# Patient Record
Sex: Female | Born: 1980 | Race: Black or African American | Hispanic: No | Marital: Single | Smoking: Never smoker
Health system: Southern US, Community
[De-identification: ages and names within clinical notes are randomized; demographics above are authoritative.]

## PROBLEM LIST (undated history)

## (undated) DIAGNOSIS — I10 Essential (primary) hypertension: Secondary | ICD-10-CM

## (undated) DIAGNOSIS — R011 Cardiac murmur, unspecified: Secondary | ICD-10-CM

## (undated) HISTORY — DX: Cardiac murmur, unspecified: R01.1

## (undated) HISTORY — DX: Essential (primary) hypertension: I10

---
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
## 2001-11-12 ENCOUNTER — Encounter: Payer: Self-pay | Admitting: Emergency Medicine

## 2001-11-12 ENCOUNTER — Emergency Department (HOSPITAL_COMMUNITY): Admission: EM | Admit: 2001-11-12 | Discharge: 2001-11-12 | Payer: Self-pay | Admitting: Emergency Medicine

## 2005-08-22 ENCOUNTER — Ambulatory Visit: Payer: Self-pay | Admitting: Family Medicine

## 2005-08-29 ENCOUNTER — Ambulatory Visit: Payer: Self-pay | Admitting: *Deleted

## 2005-09-20 ENCOUNTER — Ambulatory Visit: Payer: Self-pay | Admitting: Family Medicine

## 2005-12-30 ENCOUNTER — Encounter (INDEPENDENT_AMBULATORY_CARE_PROVIDER_SITE_OTHER): Payer: Self-pay | Admitting: Family Medicine

## 2005-12-30 ENCOUNTER — Ambulatory Visit: Payer: Self-pay | Admitting: Family Medicine

## 2005-12-30 LAB — CONVERTED CEMR LAB: TSH: 1.032 microintl units/mL

## 2006-01-06 ENCOUNTER — Ambulatory Visit (HOSPITAL_COMMUNITY): Admission: RE | Admit: 2006-01-06 | Discharge: 2006-01-06 | Payer: Self-pay | Admitting: Family Medicine

## 2006-02-13 ENCOUNTER — Ambulatory Visit: Payer: Self-pay | Admitting: Family Medicine

## 2006-02-27 ENCOUNTER — Encounter (INDEPENDENT_AMBULATORY_CARE_PROVIDER_SITE_OTHER): Payer: Self-pay | Admitting: *Deleted

## 2006-02-27 ENCOUNTER — Ambulatory Visit: Payer: Self-pay | Admitting: Family Medicine

## 2006-06-09 ENCOUNTER — Ambulatory Visit: Payer: Self-pay | Admitting: Family Medicine

## 2006-06-09 ENCOUNTER — Encounter (INDEPENDENT_AMBULATORY_CARE_PROVIDER_SITE_OTHER): Payer: Self-pay | Admitting: *Deleted

## 2006-06-09 LAB — CONVERTED CEMR LAB: Pap Smear: NORMAL

## 2006-08-13 ENCOUNTER — Ambulatory Visit: Payer: Self-pay | Admitting: Family Medicine

## 2006-08-15 ENCOUNTER — Ambulatory Visit: Payer: Self-pay | Admitting: Family Medicine

## 2007-01-31 ENCOUNTER — Emergency Department (HOSPITAL_COMMUNITY): Admission: EM | Admit: 2007-01-31 | Discharge: 2007-01-31 | Payer: Self-pay | Admitting: Family Medicine

## 2007-02-06 ENCOUNTER — Ambulatory Visit: Payer: Self-pay | Admitting: Internal Medicine

## 2007-02-18 ENCOUNTER — Ambulatory Visit: Payer: Self-pay | Admitting: Family Medicine

## 2007-04-06 ENCOUNTER — Ambulatory Visit: Payer: Self-pay | Admitting: Family Medicine

## 2007-04-09 ENCOUNTER — Encounter (INDEPENDENT_AMBULATORY_CARE_PROVIDER_SITE_OTHER): Payer: Self-pay | Admitting: Family Medicine

## 2007-04-09 DIAGNOSIS — I10 Essential (primary) hypertension: Secondary | ICD-10-CM | POA: Insufficient documentation

## 2007-04-10 ENCOUNTER — Ambulatory Visit: Payer: Self-pay | Admitting: Family Medicine

## 2007-04-13 ENCOUNTER — Ambulatory Visit: Payer: Self-pay | Admitting: Family Medicine

## 2007-04-13 DIAGNOSIS — F329 Major depressive disorder, single episode, unspecified: Secondary | ICD-10-CM

## 2007-04-16 ENCOUNTER — Ambulatory Visit: Payer: Self-pay | Admitting: Internal Medicine

## 2007-04-21 ENCOUNTER — Ambulatory Visit: Payer: Self-pay | Admitting: Internal Medicine

## 2007-04-30 ENCOUNTER — Ambulatory Visit: Payer: Self-pay | Admitting: Internal Medicine

## 2007-05-06 ENCOUNTER — Encounter (INDEPENDENT_AMBULATORY_CARE_PROVIDER_SITE_OTHER): Payer: Self-pay | Admitting: *Deleted

## 2007-05-14 ENCOUNTER — Ambulatory Visit: Payer: Self-pay | Admitting: Internal Medicine

## 2007-05-28 ENCOUNTER — Ambulatory Visit: Payer: Self-pay | Admitting: Internal Medicine

## 2007-06-11 ENCOUNTER — Ambulatory Visit: Payer: Self-pay | Admitting: Internal Medicine

## 2007-06-25 ENCOUNTER — Ambulatory Visit: Payer: Self-pay | Admitting: Internal Medicine

## 2007-10-06 ENCOUNTER — Ambulatory Visit: Payer: Self-pay | Admitting: Family Medicine

## 2007-10-06 LAB — CONVERTED CEMR LAB
ALT: 12 units/L (ref 0–35)
AST: 12 units/L (ref 0–37)
Albumin: 4.3 g/dL (ref 3.5–5.2)
Alkaline Phosphatase: 97 units/L (ref 39–117)
BUN: 13 mg/dL (ref 6–23)
CO2: 25 meq/L (ref 19–32)
Calcium: 9.2 mg/dL (ref 8.4–10.5)
Chloride: 103 meq/L (ref 96–112)
Cholesterol: 171 mg/dL (ref 0–200)
Creatinine, Ser: 0.86 mg/dL (ref 0.40–1.20)
Glucose, Bld: 70 mg/dL (ref 70–99)
HDL: 80 mg/dL (ref >39)
LDL Cholesterol: 84 mg/dL (ref 0–99)
Potassium: 4.2 meq/L (ref 3.5–5.3)
Sodium: 140 meq/L (ref 135–145)
Total Bilirubin: 0.4 mg/dL (ref 0.3–1.2)
Total CHOL/HDL Ratio: 2.1
Total Protein: 7.8 g/dL (ref 6.0–8.3)
Triglycerides: 35 mg/dL (ref <150)
VLDL: 7 mg/dL (ref 0–40)

## 2007-10-08 ENCOUNTER — Ambulatory Visit: Payer: Self-pay | Admitting: Family Medicine

## 2007-11-04 ENCOUNTER — Ambulatory Visit: Payer: Self-pay | Admitting: Family Medicine

## 2007-11-04 ENCOUNTER — Encounter (INDEPENDENT_AMBULATORY_CARE_PROVIDER_SITE_OTHER): Payer: Self-pay | Admitting: Family Medicine

## 2007-11-04 LAB — CONVERTED CEMR LAB
Chlamydia, DNA Probe: NEGATIVE
GC Probe Amp, Genital: NEGATIVE
Pap Smear: NORMAL

## 2007-11-11 ENCOUNTER — Telehealth (INDEPENDENT_AMBULATORY_CARE_PROVIDER_SITE_OTHER): Payer: Self-pay | Admitting: Family Medicine

## 2007-11-20 ENCOUNTER — Encounter (INDEPENDENT_AMBULATORY_CARE_PROVIDER_SITE_OTHER): Payer: Self-pay | Admitting: Family Medicine

## 2008-01-01 ENCOUNTER — Ambulatory Visit: Payer: Self-pay | Admitting: Nurse Practitioner

## 2008-01-01 DIAGNOSIS — J329 Chronic sinusitis, unspecified: Secondary | ICD-10-CM | POA: Insufficient documentation

## 2008-01-19 ENCOUNTER — Ambulatory Visit: Payer: Self-pay | Admitting: Nurse Practitioner

## 2008-01-19 DIAGNOSIS — R51 Headache: Secondary | ICD-10-CM

## 2008-01-19 DIAGNOSIS — R609 Edema, unspecified: Secondary | ICD-10-CM

## 2008-01-19 DIAGNOSIS — R519 Headache, unspecified: Secondary | ICD-10-CM | POA: Insufficient documentation

## 2008-01-20 ENCOUNTER — Ambulatory Visit (HOSPITAL_COMMUNITY): Admission: RE | Admit: 2008-01-20 | Discharge: 2008-01-20 | Payer: Self-pay | Admitting: Internal Medicine

## 2008-01-22 ENCOUNTER — Telehealth (INDEPENDENT_AMBULATORY_CARE_PROVIDER_SITE_OTHER): Payer: Self-pay | Admitting: Nurse Practitioner

## 2008-02-23 ENCOUNTER — Emergency Department (HOSPITAL_COMMUNITY): Admission: EM | Admit: 2008-02-23 | Discharge: 2008-02-23 | Payer: Self-pay | Admitting: Emergency Medicine

## 2008-05-27 ENCOUNTER — Emergency Department (HOSPITAL_COMMUNITY): Admission: EM | Admit: 2008-05-27 | Discharge: 2008-05-27 | Payer: Self-pay | Admitting: Family Medicine

## 2008-08-31 ENCOUNTER — Telehealth (INDEPENDENT_AMBULATORY_CARE_PROVIDER_SITE_OTHER): Payer: Self-pay | Admitting: *Deleted

## 2008-09-15 ENCOUNTER — Encounter (INDEPENDENT_AMBULATORY_CARE_PROVIDER_SITE_OTHER): Payer: Self-pay | Admitting: *Deleted

## 2008-09-26 ENCOUNTER — Telehealth (INDEPENDENT_AMBULATORY_CARE_PROVIDER_SITE_OTHER): Payer: Self-pay | Admitting: Family Medicine

## 2008-10-26 ENCOUNTER — Telehealth (INDEPENDENT_AMBULATORY_CARE_PROVIDER_SITE_OTHER): Payer: Self-pay | Admitting: *Deleted

## 2008-12-27 ENCOUNTER — Encounter (INDEPENDENT_AMBULATORY_CARE_PROVIDER_SITE_OTHER): Payer: Self-pay | Admitting: Family Medicine

## 2008-12-27 ENCOUNTER — Ambulatory Visit: Payer: Self-pay | Admitting: Family Medicine

## 2008-12-27 LAB — CONVERTED CEMR LAB
Basophils Relative: 0 % (ref 0–1)
Bilirubin Urine: NEGATIVE
Blood Glucose, Fingerstick: 69
Blood in Urine, dipstick: NEGATIVE
CO2: 22 meq/L (ref 19–32)
Cholesterol: 172 mg/dL (ref 0–200)
Creatinine, Ser: 0.9 mg/dL (ref 0.40–1.20)
Eosinophils Absolute: 0.1 10*3/uL (ref 0.0–0.7)
Eosinophils Relative: 1 % (ref 0–5)
Glucose, Bld: 79 mg/dL (ref 70–99)
Glucose, Urine, Semiquant: NEGATIVE
Hemoglobin: 13.7 g/dL (ref 12.0–15.0)
Ketones, urine, test strip: NEGATIVE
MCHC: 32.9 g/dL (ref 30.0–36.0)
MCV: 85.8 fL (ref 78.0–100.0)
Monocytes Absolute: 0.4 10*3/uL (ref 0.1–1.0)
Monocytes Relative: 6 % (ref 3–12)
Nitrite: NEGATIVE
Protein, U semiquant: NEGATIVE
RBC: 4.86 M/uL (ref 3.87–5.11)
Specific Gravity, Urine: 1.01
Total Bilirubin: 0.3 mg/dL (ref 0.3–1.2)
Total CHOL/HDL Ratio: 2.1
Triglycerides: 40 mg/dL (ref <150)
Urobilinogen, UA: 0.2
VLDL: 8 mg/dL (ref 0–40)
WBC Urine, dipstick: NEGATIVE
pH: 7.5

## 2009-01-14 ENCOUNTER — Encounter (INDEPENDENT_AMBULATORY_CARE_PROVIDER_SITE_OTHER): Payer: Self-pay | Admitting: Family Medicine

## 2009-06-05 ENCOUNTER — Emergency Department (HOSPITAL_COMMUNITY): Admission: EM | Admit: 2009-06-05 | Discharge: 2009-06-05 | Payer: Self-pay | Admitting: Emergency Medicine

## 2009-08-19 DIAGNOSIS — R011 Cardiac murmur, unspecified: Secondary | ICD-10-CM

## 2009-08-19 HISTORY — DX: Cardiac murmur, unspecified: R01.1

## 2010-01-18 ENCOUNTER — Telehealth (INDEPENDENT_AMBULATORY_CARE_PROVIDER_SITE_OTHER): Payer: Self-pay | Admitting: Nurse Practitioner

## 2010-02-12 ENCOUNTER — Ambulatory Visit: Payer: Self-pay | Admitting: Nurse Practitioner

## 2010-02-12 DIAGNOSIS — R011 Cardiac murmur, unspecified: Secondary | ICD-10-CM

## 2010-02-12 DIAGNOSIS — R5381 Other malaise: Secondary | ICD-10-CM

## 2010-02-12 DIAGNOSIS — E669 Obesity, unspecified: Secondary | ICD-10-CM

## 2010-02-12 DIAGNOSIS — R5383 Other fatigue: Secondary | ICD-10-CM

## 2010-02-12 LAB — CONVERTED CEMR LAB
ALT: 14 units/L (ref 0–35)
Albumin: 4.4 g/dL (ref 3.5–5.2)
Basophils Relative: 0 % (ref 0–1)
Blood in Urine, dipstick: NEGATIVE
CO2: 27 meq/L (ref 19–32)
Calcium: 9.4 mg/dL (ref 8.4–10.5)
Chlamydia, DNA Probe: NEGATIVE
Chloride: 101 meq/L (ref 96–112)
Cholesterol: 186 mg/dL (ref 0–200)
Glucose, Bld: 101 mg/dL — ABNORMAL HIGH (ref 70–99)
Glucose, Urine, Semiquant: NEGATIVE
Hemoglobin: 13.2 g/dL (ref 12.0–15.0)
Ketones, urine, test strip: NEGATIVE
Lymphocytes Relative: 32 % (ref 12–46)
Lymphs Abs: 1.5 10*3/uL (ref 0.7–4.0)
MCHC: 33 g/dL (ref 30.0–36.0)
Microalb, Ur: 0.5 mg/dL (ref 0.00–1.89)
Monocytes Absolute: 0.1 10*3/uL (ref 0.1–1.0)
Monocytes Relative: 3 % (ref 3–12)
Neutro Abs: 2.9 10*3/uL (ref 1.7–7.7)
Potassium: 3.9 meq/L (ref 3.5–5.3)
Protein, U semiquant: NEGATIVE
RBC: 4.68 M/uL (ref 3.87–5.11)
Sodium: 139 meq/L (ref 135–145)
Total Protein: 8 g/dL (ref 6.0–8.3)
Triglycerides: 79 mg/dL (ref <150)
WBC Urine, dipstick: NEGATIVE
WBC: 4.6 10*3/uL (ref 4.0–10.5)

## 2010-02-13 ENCOUNTER — Encounter (INDEPENDENT_AMBULATORY_CARE_PROVIDER_SITE_OTHER): Payer: Self-pay | Admitting: Nurse Practitioner

## 2010-02-27 ENCOUNTER — Encounter (INDEPENDENT_AMBULATORY_CARE_PROVIDER_SITE_OTHER): Payer: Self-pay | Admitting: Internal Medicine

## 2010-02-27 ENCOUNTER — Ambulatory Visit (HOSPITAL_COMMUNITY): Admission: RE | Admit: 2010-02-27 | Discharge: 2010-02-27 | Payer: Self-pay | Admitting: Internal Medicine

## 2010-03-01 ENCOUNTER — Telehealth (INDEPENDENT_AMBULATORY_CARE_PROVIDER_SITE_OTHER): Payer: Self-pay | Admitting: Nurse Practitioner

## 2010-03-05 ENCOUNTER — Ambulatory Visit: Payer: Self-pay | Admitting: Physician Assistant

## 2010-09-18 NOTE — Progress Notes (Signed)
Summary: REFILL ON BIRTHCONTROL/BP   Phone Note Call from Patient Call back at Home Phone [phone]   Reason for Call: Refill Medication Summary of Call: Elizabeth Crane. MS LITLE STOPPED BY BECAUSE SHE WILL NEED A REFILL ON ORTHO TRI-CYCLEN AND LOTENSIN TO BECALLED INTO [place] [facility]. ON HER BIRTHCONTROL SHE IS TO START THE PACK ON THIS SUNDAY. SHE IS SCHED FOR HER CPP ON 06/27 Initial call taken by: Kimberly Tinnin,  January 18, 2010 2:20 PM  Follow-up for Phone Call        forward to N. Martin, fnp Follow-up by: Brenda Craddock,  January 18, 2010 2:41 PM  Additional Follow-up for Phone Call Additional follow up Details #1::        Refills sent to [place] [facility] for 1 month. Advise Crane to keep CPE appt for more refills    Additional Follow-up by: Kimya Mccahill Martin FNP,  January 18, 2010 2:43 PM    Additional Follow-up for Phone Call Additional follow up Details #2::    Left message on answering machine to return call. Theresa Laib RN  January 18, 2010 3:21 PM  Crane informed. Follow-up by: Brenda Craddock,  January 18, 2010 3:38 PM  Prescriptions: ORTHO TRI-CYCLEN (28) 0.18/0.215/0.25 MG-35 MCG TABS (NORGESTIM-ETH ESTRAD TRIPHASIC) One tablet by mouth daily  #1 package x 0   Entered and Authorized by:   Daniyla Pfahler Martin FNP   Signed by:   Makyah Lavigne Martin FNP on 01/18/2010   Method used:   Faxed to ...       [facility]       [address]       [place], [place]  [address]       Ph: [phone]       Fax: [phone]   RxID:   [id number] LOTENSIN HCT 10-12.5 MG  TABS (BENAZEPRIL-HYDROCHLOROTHIAZIDE) 1 tablet by mouth daily for blood pressure  #30 x 0   Entered and Authorized by:   Rosamund Nyland Martin FNP   Signed by:   Krosby Ritchie Martin FNP on 01/18/2010   Method used:   Faxed to ...       [facility]       [address] [place], [place]  [address]       Ph: [phone]       Fax:  [phone]   RxID:   [id number]

## 2010-09-18 NOTE — Progress Notes (Signed)
Summary: WANTS ULTRASOUND RESULTS   Phone Note Call from Patient Call back at Home Phone [phone]   Reason for Call: Lab or Test Results Summary of Call: MARTIN PT. MS Mckiddy  CALLED AND WOULD LIKE HER ULTRASOUND RESULT FROM HER HEART TEST. Initial call taken by: Leodis Rains,  March 01, 2010 12:41 PM  Follow-up for Phone Call        forward to Jesse Fall, fnp Follow-up by: Levon Hedger,  March 01, 2010 12:52 PM  Additional Follow-up for Phone Call Additional follow up Details #1::        notify pt that results are normal Additional Follow-up by: Lehman Prom FNP,  March 01, 2010 1:55 PM    Additional Follow-up for Phone Call Additional follow up Details #2::    Levon Hedger  March 01, 2010 3:06 PM Left message on machine for pt to return call to the office.  Levon Hedger  March 02, 2010 10:54 AM Left message with pt's father for pt to return call to the office.  Additional Follow-up for Phone Call Additional follow up Details #3:: Details for Additional Follow-up Action Taken: PATIENT CALLED AND IS AWARE OF HER RESULTS. Additional Follow-up by: Leodis Rains,  March 02, 2010 4:21 PM    Echocardiogram  Procedure date:  02/27/2010  Findings:      normal   Echocardiogram  Procedure date:  02/27/2010  Findings:      normal

## 2010-09-18 NOTE — Letter (Signed)
Summary: TEST ORDER FORM  TEST ORDER FORM   Imported By: Arta Bruce 02/14/2010 11:19:20  _____________________________________________________________________  External Attachment:    Type:   Image     Comment:   External Document

## 2010-09-18 NOTE — Assessment & Plan Note (Signed)
Summary: Complete Physical Exam   Vital Signs:  Patient profile:   30 year old female Menstrual status:  regular LMP:     01/13/2010 Weight:      188 pounds BMI:     30.00 Temp:     98.7 degrees F Pulse rate:   109 / minute Pulse rhythm:   regular Resp:     18 per minute BP sitting:   148 / 87  (left arm) Cuff size:   regular  Vitals Entered By: Vesta Mixer CMA (February 12, 2010 8:56 AM) CC: CPP.   Feeling tired a lot.   Per pt not taking trazadone any longer., Hypertension Management, Depression Is Patient Diabetic? No Pain Assessment Patient in pain? no       Does patient need assistance? Ambulation Normal LMP (date): 01/13/2010 LMP - Character: normal     Menstrual Status regular Enter LMP: 01/13/2010 Last PAP Result NEGATIVE FOR INTRAEPITHELIAL LESIONS OR MALIGNANCY.   CC:  CPP.   Feeling tired a lot.   Per pt not taking trazadone any longer., Hypertension Management, and Depression.  History of Present Illness:  Pt into the office for a complete physical exam  PAP - last PAP done 1 year ago in this office - normal then however pt has had an abnormal pap smear previously.   no family hx of cerival or ovarian cancer Currently on oral contraceptives  Pt is not sexually active and has not been in the past 7 months. No childrent  Mammogram - no history of mammogram no family history of breast cancer no self breast exams at home  Dental - no recent dental exam. 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 Pt presents today with a form - Staff medical report for completion  Obesity - up 11 pounds since her last visit.  Pt is not doing any exercise.  Hypertension History:      She denies headache, chest pain, and palpitations.  She notes no problems with any antihypertensive medication side effects.  Pt is taking her blood medications as  ordered and she took this morning. no blood pressure checks outside this office. Tries to avoid salt.        Positive major cardiovascular risk factors include hypertension.  Negative major cardiovascular risk factors include female age less than 64 years old, negative family history for ischemic heart disease, and non-tobacco-user status.      Habits & Providers  Alcohol-Tobacco-Diet     Alcohol drinks/day: <1     Alcohol type: wine      Tobacco Status: never  Exercise-Depression-Behavior     Does Patient Exercise: no     Exercise Counseling: to improve exercise regimen     Times/week: 6     Drug Use: no     Seat Belt Use: 100     Sun Exposure: occasionally  Comments: PHQ-9 score = 10  Current Medications (verified): 1)  Trazodone Hcl 50 Mg Tabs (Trazodone Hcl) .Marland Kitchen.. 1 By Mouth Qhs 2)  Lotensin Hct 10-12.5 Mg  Tabs (Benazepril-Hydrochlorothiazide) .Marland Kitchen.. 1 Tablet By Mouth Daily For Blood Pressure 3)  Ortho Tri-Cyclen (28) 0.18/0.215/0.25 Mg-35 Mcg Tabs (Norgestim-Eth Estrad Triphasic) .... One Tablet By Mouth Daily  Allergies (verified): No Known Drug Allergies  Social History: Does Patient Exercise:  no  Review of Systems General:  Complains of  fatigue. Eyes:  Denies blurring. ENT:  Denies earache. CV:  Complains of swelling of feet; swelling in feet daily - when she wake the feet are not swelling but once she starts to move around in the morning then swelling starts.. Resp:  Denies cough. GI:  Denies abdominal pain, nausea, and vomiting. GU:  Denies dysuria. MS:  Complains of joint pain. Derm:  Denies rash. Neuro:  Denies headaches. Psych:  Denies anxiety and depression.  Physical Exam  General:  alert.   Head:  normocephalic.   Eyes:  pupils equal and pupils round.   Ears:  bil TM with bony landmarks present ear piercing(s) noted.    Nose:  no nasal discharge.   Mouth:  pharynx pink and moist and fair dentition.   Neck:  full ROM.   Chest Wall:  no mass.     Breasts:  skin/areolae normal and no masses.   Lungs:  normal breath sounds.   Heart:  systolic mumur regular rhythm.   Abdomen:  soft, non-tender, and normal bowel sounds.   Rectal:  no external abnormalities.   Msk:  up to the exam  Extremities:  1+ left pedal edema and 1+ right pedal edema.   Neurologic:  alert & oriented X3.   Skin:  color normal.   Psych:  Oriented X3.    Pelvic Exam  Vulva:      normal appearance.   Urethra and Bladder:      Urethra--no discharge.   Vagina:      thick white discharge Cervix:      midposition.   Uterus:      smooth.   Adnexa:      nontender bilaterally.   Rectum:      normal.      Impression & Recommendations:  Problem # 1:  ROUTINE GYNECOLOGICAL EXAMINATION (ICD-V72.31) labs done PHQ-9 score = 10 advise optho and dental exam PAP done Orders: KOH/ WET Mount [phone]) Pap Smear, Thin Prep ( Collection of) [phone]) T- GC Chlamydia (13086) T-Drug Screen-Urine, (single) [phone]) UA Dipstick w/o Micro (manual) (81002) T-HIV Antibody  (Reflex) ([id number]) T-Syphilis Test (RPR) (02725-36644)  Problem # 2:  FATIGUE (ICD-780.79) will check labs today will also order an echo - ? murmur today Orders: T-CBC w/Diff ([id number]) T-HIV Antibody  (Reflex) ([id number]) T-TSH (51884-16606) 2 D Echo (2 D Echo)  Problem # 3:  HYPERTENSION (ICD-401.9) BP is elevated advised pt to monitor diet Her updated medication list for this problem includes:    Lotensin Hct 20-25 Mg Tabs (Benazepril-hydrochlorothiazide) ..... One tablet by mouth daily for blood pressure  **note change in dose**  Orders: T-Lipid Profile (30160-10932) T-Comprehensive Metabolic Panel (35573-22025)  Problem # 4:  OBESITY (ICD-278.00) advise pt that she needs to become more active to decrease weight  Problem # 5:  MURMUR (ICD-785.2) will order echo Orders: 2 D Echo (2 D Echo)  Complete Medication List: 1)  Lotensin Hct 20-25 Mg Tabs  (Benazepril-hydrochlorothiazide) .... One tablet by mouth daily for blood pressure  **note change in dose** 2)  Ortho Tri-cyclen (28) 0.18/0.215/0.25 Mg-35 Mcg Tabs (Norgestim-eth estrad triphasic) .... One tablet by mouth daily  Hypertension Assessment/Plan:      The patient's hypertensive risk group is category A: No risk factors and no target organ damage.  Her calculated 10 year risk of coronary heart disease is 1 %.  Today's blood pressure is 148/87.  Her blood pressure goal is < 140/90.  Patient Instructions: 1)  Blood pressure - elevated 2)  will increase blood pressure medication 3)  Be sure to avoid salty foods 4)  Birth control - continue 5)  Weight control - remember you need to find time in your schedule to exercise. Review [employer] membership and see if this is something you would be interested in joining.  Call them for more details. 6)  Your prescriptions have been sent to [facility] pharmacy  7)  Feet swelling, fatigue, ?murmur - You will be scheduled an echo to check your heart 8)  Follow up in 3 weeks for blood pressure check - lab visit. 9)  Take your medications before this visit Prescriptions: ORTHO TRI-CYCLEN (28) 0.18/0.215/0.25 MG-35 MCG TABS (NORGESTIM-ETH ESTRAD TRIPHASIC) One tablet by mouth daily  #1 package x 11   Entered and Authorized by:   Lehman Prom FNP   Signed by:   Lehman Prom FNP on 02/12/2010   Method used:   Faxed to ...       [facility]       [address] [place], [place]  [address]       Ph: [phone]       Fax: [phone]   RxID:   [id number] LOTENSIN HCT 20-25 MG TABS (BENAZEPRIL-HYDROCHLOROTHIAZIDE) One tablet by mouth daily for blood pressure  **note change in dose**  #30 x 5   Entered and Authorized by:   Lehman Prom FNP   Signed by:   Lehman Prom FNP on 02/12/2010   Method used:   Faxed to ...       [facility] (retail)        [address] [place], [place]  [address]       Ph: [phone]       Fax: [phone]   RxID:   [id number]   Laboratory Results   Urine Tests  Date/Time Received: February 12, 2010 10:12 AM   Routine Urinalysis   Glucose: negative   (Normal Range: Negative) Bilirubin: negative   (Normal Range: Negative) Ketone: negative   (Normal Range: Negative) Spec. Gravity: <1.005   (Normal Range: 1.003-1.035) Blood: negative   (Normal Range: Negative) pH: 6.0   (Normal Range: 5.0-8.0) Protein: negative   (Normal Range: Negative) Urobilinogen: 0.2   (Normal Range: 0-1) Nitrite: negative   (Normal Range: Negative) Leukocyte Esterace: negative   (Normal Range: Negative)    Date/Time Received: February 12, 2010 10:12 AM   [employer] Mount Source: vaginal WBC/hpf: 1-5 Bacteria/hpf: rare Clue cells/hpf: none Yeast/hpf: none Wet Mount KOH: Negative Trichomonas/hpf: none

## 2010-09-18 NOTE — Letter (Signed)
Summary: *HSN Results Follow up  [facility]  [address] [place], [place] [address]   Phone: [phone]  Fax: [phone]      02/13/2010   JORDYN HOFACKER Dobler [address] [place], [place]  [address]   Dear  Ms. Karel Jarvis Leger,                            ____S.Drinkard,FNP   ____D. Gore,FNP       ____B. McPherson,MD   ____V. Rankins,MD    ____E. Mulberry,MD    _X___N. Daphine Deutscher, FNP  ____D. Reche Dixon, MD    ____K. Philipp Deputy, MD    ____Other     This letter is to inform you that your recent test(s):  ___X____Pap Smear    ____X___Lab Test     _______X-ray    ___X____ is within acceptable limits  _______ requires a medication change  _______ requires a follow-up lab visit  _______ requires a follow-up visit with your provider   Comments: Labs done during recent office visit are normal.  Pap Smear results ________________________________.       _________________________________________________________ If you have any questions, please contact our office [phone].                    Sincerely,    Lehman Prom FNP [facility]

## 2010-09-18 NOTE — Letter (Signed)
Summary: STAFF MEDICAL REPORT  STAFF MEDICAL REPORT   Imported By: Arta Bruce 02/14/2010 10:09:28  _____________________________________________________________________  External Attachment:    Type:   Image     Comment:   External Document

## 2010-09-18 NOTE — Progress Notes (Signed)
Summary: Office Visit//DEPRESSION SCREENING  Office Visit//DEPRESSION SCREENING   Imported By: Arta Bruce 03/02/2010 12:23:01  _____________________________________________________________________  External Attachment:    Type:   Image     Comment:   External Document

## 2010-10-19 ENCOUNTER — Encounter (INDEPENDENT_AMBULATORY_CARE_PROVIDER_SITE_OTHER): Payer: Self-pay | Admitting: Nurse Practitioner

## 2010-10-19 ENCOUNTER — Encounter: Payer: Self-pay | Admitting: Nurse Practitioner

## 2010-10-25 NOTE — Assessment & Plan Note (Signed)
Summary: Leg Edema   Vital Signs:  Patient profile:   30 year old female Menstrual status:  regular LMP:     09/2010 Weight:      182.1 pounds BMI:     29.06 Temp:     99.2 degrees F oral Pulse rate:   82 / minute Pulse rhythm:   regular Resp:     20 per minute BP sitting:   147 / 86  (left arm) Cuff size:   regular  Vitals Entered By: Levon Hedger (October 19, 2010 12:18 PM)  Nutrition Counseling: Patient's BMI is greater than 25 and therefore counseled on weight management options. CC: swelling in legs and ankles...BP check, Hypertension Management Is Patient Diabetic? No Pain Assessment Patient in pain? yes     Location: legs  Does patient need assistance? Functional Status Self care Ambulation Normal LMP (date): 09/2010 LMP - Character: normal     Enter LMP: 09/2010 Last PAP Result  Specimen Adequacy: Satisfactory for evaluation.   Interpretation/Result:Negative for intraepithelial Lesion or Malignancy.      CC:  swelling in legs and ankles...BP check and Hypertension Management.  History of Present Illness:  Pt into the office with c/o ankle swelling.  This has been ongoing for at least the past 10 years.  She has tried to restrict her sodium and also elevates her feet when at all possible Normal ECHO done 02/2010 She previously had TED hose but dose not like to wear them   Social - pt is working daily and is up on her feet for at least 8 hours a day   Hypertension History:      She complains of peripheral edema, but denies headache, chest pain, and palpitations.  She notes no problems with any antihypertensive medication side effects.  pt has already taken her meds today.        Positive major cardiovascular risk factors include hypertension.  Negative major cardiovascular risk factors include female age less than 15 years old, negative family history for ischemic heart disease, and non-tobacco-user status.        Further assessment for target organ damage  reveals no history of ASHD, cardiac end-organ damage (CHF/LVH), stroke/TIA, peripheral vascular disease, renal insufficiency, or hypertensive retinopathy.    Habits & Providers  Alcohol-Tobacco-Diet     Alcohol drinks/day: <1     Alcohol type: wine      Tobacco Status: never  Exercise-Depression-Behavior     Does Patient Exercise: yes     Exercise Counseling: to improve exercise regimen     Type of exercise: cardio, lift weights     Times/week: 6     Drug Use: no     Seat Belt Use: 100     Sun Exposure: occasionally  Allergies (verified): No Known Drug Allergies  Social History: Does Patient Exercise:  yes  Review of Systems General:  Complains of fatigue; denies fever. CV:  Complains of swelling of feet; denies chest pain or discomfort; Sometimes she wakes in the morning with swelling in the ankle but not all the time. Resp:  Denies cough and shortness of breath. GI:  Denies abdominal pain, nausea, and vomiting. MS:  pain in feet.  Physical Exam  General:  alert.   Head:  normocephalic.   Ears:  ear piercing(s) noted.   Abdomen:  normal bowel sounds.   Msk:  normal ROM.   Neurologic:  alert & oriented X3.     Impression & Recommendations:  Problem # 1:  LEG EDEMA, BILATERAL (ICD-782.3) Will given furosemide as needed  advised pt to wear ted hose continue to monitor sodium in her diet Her updated medication list for this problem includes:    Lotensin Hct 20-25 Mg Tabs (Benazepril-hydrochlorothiazide) ..... One tablet by mouth daily for blood pressure    Furosemide 20 Mg Tabs (Furosemide) ..... One tablet by mouth daily as needed f or fluid  Problem # 2:  HYPERTENSION (ICD-401.9) BP slightly elevated today will continue to monitor DASH diet Her updated medication list for this problem includes:    Lotensin Hct 20-25 Mg Tabs (Benazepril-hydrochlorothiazide) ..... One tablet by mouth daily for blood pressure    Furosemide 20 Mg Tabs (Furosemide) ..... One tablet  by mouth daily as needed f or fluid  Complete Medication List: 1)  Lotensin Hct 20-25 Mg Tabs (Benazepril-hydrochlorothiazide) .... One tablet by mouth daily for blood pressure 2)  Ortho Tri-cyclen (28) 0.18/0.215/0.25 Mg-35 Mcg Tabs (Norgestim-eth estrad triphasic) .... One tablet by mouth daily 3)  Furosemide 20 Mg Tabs (Furosemide) .... One tablet by mouth daily as needed f or fluid  Hypertension Assessment/Plan:      The patient's hypertensive risk group is category A: No risk factors and no target organ damage.  Her calculated 10 year risk of coronary heart disease is 1 %.  Today's blood pressure is 147/86.  Her blood pressure goal is < 140/90.   Patient Instructions: 1)  Keep taking your blood pressure medications as ordered. 2)  You still need to monitor the salt in your diet.  Exercise is also helpful. 3)  Take furosemide 20mg  by mouth daily AS NEEDED for swelling. 4)  Wear ted hose (support stockings) when you at work 5)  Follow up in July 2012 for a complete physical exam. 6)  Come fasting for labs and PAP. Prescriptions: FUROSEMIDE 20 MG TABS (FUROSEMIDE) One tablet by mouth daily as needed f or fluid  #30 x 0   Entered and Authorized by:   Lehman Prom FNP   Signed by:   Lehman Prom FNP on 10/19/2010   Method used:   Print then Give to Patient   RxID:   [id number]    Orders Added: 1)  Est. Patient Level III [56213]

## 2010-11-05 ENCOUNTER — Inpatient Hospital Stay (INDEPENDENT_AMBULATORY_CARE_PROVIDER_SITE_OTHER)
Admission: RE | Admit: 2010-11-05 | Discharge: 2010-11-05 | Disposition: A | Discharge status: Home / Self Care | Payer: Self-pay | Source: Ambulatory Visit | Attending: Emergency Medicine | Admitting: Emergency Medicine

## 2010-11-05 DIAGNOSIS — J039 Acute tonsillitis, unspecified: Secondary | ICD-10-CM

## 2010-11-07 ENCOUNTER — Inpatient Hospital Stay (INDEPENDENT_AMBULATORY_CARE_PROVIDER_SITE_OTHER)
Admission: RE | Admit: 2010-11-07 | Discharge: 2010-11-07 | Disposition: A | Discharge status: Home / Self Care | Payer: Self-pay | Source: Ambulatory Visit

## 2010-11-07 DIAGNOSIS — B341 Enterovirus infection, unspecified: Secondary | ICD-10-CM

## 2010-11-22 LAB — STREP A DNA PROBE: Group A Strep Probe: NEGATIVE

## 2010-11-22 LAB — POCT RAPID STREP A (OFFICE): Streptococcus, Group A Screen (Direct): NEGATIVE

## 2011-05-16 LAB — URINALYSIS, ROUTINE W REFLEX MICROSCOPIC
Glucose, UA: 100 — AB
Hgb urine dipstick: NEGATIVE
Ketones, ur: NEGATIVE
Protein, ur: 30 — AB
Urobilinogen, UA: 0.2

## 2011-05-16 LAB — POCT I-STAT, CHEM 8
Calcium, Ion: 1.06 — ABNORMAL LOW
Creatinine, Ser: 1.4 — ABNORMAL HIGH
Glucose, Bld: 126 — ABNORMAL HIGH
HCT: 39
Hemoglobin: 13.3
Potassium: 3.4 — ABNORMAL LOW

## 2011-05-16 LAB — CBC
HCT: 36
MCHC: 33.2
MCV: 86.1
Platelets: 253
WBC: 6.9

## 2011-05-16 LAB — DIFFERENTIAL
Basophils Relative: 1
Eosinophils Absolute: 0
Eosinophils Relative: 0
Lymphs Abs: 1.3
Monocytes Relative: 6
Neutrophils Relative %: 74

## 2011-05-16 LAB — RAPID URINE DRUG SCREEN, HOSP PERFORMED
Amphetamines: NOT DETECTED
Benzodiazepines: NOT DETECTED
Cocaine: NOT DETECTED
Opiates: NOT DETECTED
Tetrahydrocannabinol: NOT DETECTED

## 2011-05-16 LAB — POCT PREGNANCY, URINE: Preg Test, Ur: NEGATIVE

## 2011-06-06 LAB — T3, FREE: T3, Free: 2.8 (ref 2.3–4.2)

## 2012-07-13 ENCOUNTER — Ambulatory Visit (INDEPENDENT_AMBULATORY_CARE_PROVIDER_SITE_OTHER): Payer: Self-pay | Admitting: Family Medicine

## 2012-07-13 ENCOUNTER — Other Ambulatory Visit (HOSPITAL_COMMUNITY)
Admission: RE | Admit: 2012-07-13 | Discharge: 2012-07-13 | Disposition: A | Discharge status: Home / Self Care | Payer: Self-pay | Source: Ambulatory Visit | Attending: Family Medicine | Admitting: Family Medicine

## 2012-07-13 ENCOUNTER — Encounter: Payer: Self-pay | Admitting: Family Medicine

## 2012-07-13 ENCOUNTER — Telehealth: Payer: Self-pay | Admitting: Family Medicine

## 2012-07-13 VITALS — BP 122/78 | HR 112 | Temp 99.3°F | Ht 66.5 in | Wt 173.0 lb

## 2012-07-13 DIAGNOSIS — Z202 Contact with and (suspected) exposure to infections with a predominantly sexual mode of transmission: Secondary | ICD-10-CM | POA: Insufficient documentation

## 2012-07-13 DIAGNOSIS — R011 Cardiac murmur, unspecified: Secondary | ICD-10-CM

## 2012-07-13 DIAGNOSIS — I10 Essential (primary) hypertension: Secondary | ICD-10-CM

## 2012-07-13 DIAGNOSIS — N898 Other specified noninflammatory disorders of vagina: Secondary | ICD-10-CM

## 2012-07-13 DIAGNOSIS — Z113 Encounter for screening for infections with a predominantly sexual mode of transmission: Secondary | ICD-10-CM | POA: Insufficient documentation

## 2012-07-13 DIAGNOSIS — Z9189 Other specified personal risk factors, not elsewhere classified: Secondary | ICD-10-CM

## 2012-07-13 LAB — POCT WET PREP (WET MOUNT)

## 2012-07-13 MED ORDER — METRONIDAZOLE 500 MG PO TABS: 500.0000 mg | ORAL_TABLET | Freq: Two times a day (BID) | ORAL | Status: DC

## 2012-07-13 MED ORDER — BENAZEPRIL HCL 20 MG PO TABS: 20.0000 mg | ORAL_TABLET | Freq: Every day | ORAL | Status: DC

## 2012-07-13 MED ORDER — HYDROCHLOROTHIAZIDE 25 MG PO TABS: 25.0000 mg | ORAL_TABLET | Freq: Every day | ORAL | Status: DC

## 2012-07-13 MED ORDER — NORGESTIMATE-ETH ESTRADIOL 0.25-35 MG-MCG PO TABS: 1.0000 | ORAL_TABLET | Freq: Every day | ORAL | Status: DC

## 2012-07-13 NOTE — Telephone Encounter (Signed)
Patient is returning call to Dr. Armen Pickup.

## 2012-07-13 NOTE — Progress Notes (Signed)
Subjective:     Patient ID: Elizabeth Crane, female   DOB: 18-Apr-1981, 30 y.o.   MRN: [id number]  HPI 31 yo F new patient presents to establish primary care and address the following:  1. HTN: since age 70. Taking medications but cost is high $50.  Has headaches when she is not compliant with meds. Reports chest wall pain with exercise. Denies pain with walking, radiating chest pain, syncope, SOB, blurred vision. Reports some swelling in hands and feet sometimes.   2. Unprotected sex: 2 sexual partners in the last year. Had unprotected sex on 1-2 occassions. Has scant vaginal discharge w/o itching or irritation. Compliant with OCP. LMP last week.   3. Wellness: no tobacco or drugs. Drinks alcohol 1-2 drinks monthly. exercises 3 days per week.  Review of Systems Patient Information Form: Screening and ROS  AUDIT-C Score: 4 Do you feel safe in relationships? yes, not currently in a relationship PHQ-2:negative  Review of Symptoms  General:  Negative for nexplained weight loss, fever Skin: Negative for new or changing mole, sore that won't heal HEENT: Negative for trouble hearing, trouble seeing, ringing in ears, mouth sores, hoarseness, change in voice, dysphagia. CV:  Positive chest pain, swelling in hands and feet, fast or irregular heart beat. Negative for edema. Resp: Negative for cough, dyspnea, hemoptysis GI: Negative for nausea, vomiting, diarrhea, constipation, abdominal pain, melena, hematochezia. GU: Negative for dysuria, incontinence, urinary hesitance, hematuria, vaginal or penile discharge, polyuria, sexual difficulty, lumps in testicle or breasts MSK: Negative for muscle cramps or aches, joint pain or swelling Neuro: Negative for headaches, weakness, numbness, dizziness, passing out/fainting Psych: Negative for depression, anxiety, memory problems     Objective:   Physical Exam BP 122/78  Pulse 112  Temp 99.3 F (37.4 C) (Oral)  Ht 5' 6.5" (1.689 m)  Wt 173 lb (78.472  kg)  BMI 27.50 kg/m2  LMP 07/06/2012 General appearance: alert, cooperative and no distress Neck: no adenopathy, no carotid bruit, no JVD, supple, symmetrical, trachea midline and thyroid not enlarged, symmetric, no tenderness/mass/nodules Lungs: clear to auscultation bilaterally Heart: systolic murmur: early systolic 2/6, blowing at 2nd right intercostal space Abdomen: soft, non-tender; bowel sounds normal; no masses,  no organomegaly Pelvic: cervix normal in appearance, external genitalia normal, no adnexal masses or tenderness, no cervical motion tenderness, rectovaginal septum normal, uterus normal size, shape, and consistency and vagina with scant white vaginal discharge.  Extremities: extremities normal, atraumatic, no cyanosis or edema    Assessment and Plan:

## 2012-07-13 NOTE — Telephone Encounter (Signed)
Attempted to call patient on cell and phone about BV. Unable to reach her via cell. Unable to leave message at home bc mailbox full.   Called her at work left VM asking for correct personal # (cell or home).

## 2012-07-13 NOTE — Assessment & Plan Note (Signed)
A: unprotected sex, patient concerned and desires testing. P: Wet prep, GC/chlam, HIV and RPR. Call cell with results.

## 2012-07-13 NOTE — Addendum Note (Signed)
Addended by: Dessa Phi on: 07/13/2012 01:32 PM   Modules accepted: Orders

## 2012-07-13 NOTE — Assessment & Plan Note (Signed)
A: well controlled. Meds: compliant. P: Changed meds per order due to cost F/u in 3 months

## 2012-07-13 NOTE — Patient Instructions (Addendum)
Trinty,  Thank you for coming in today. It was a pleasure meeting you.   1. See med list for changes. Meds sent to [facility] on [address].  2. Call for appt with Andria Meuse.  3. See me for labs and BP f/u in 2 months.   Dr. Armen Pickup

## 2012-07-14 ENCOUNTER — Encounter: Payer: Self-pay | Admitting: Family Medicine

## 2012-07-14 LAB — HIV ANTIBODY (ROUTINE TESTING W REFLEX): HIV: NONREACTIVE

## 2012-07-14 LAB — RPR

## 2012-07-14 NOTE — Telephone Encounter (Signed)
Returned call to patient mailbox full.  Ok for my CNA to tell patient that  Wet prep with bacterial vaginosis flagyl sent to pharmacy.  HIV and RPR is negative.  Gonorrhea and chlamydia still pending.  Will send results letter.  If patient calls back please verify best contact # where I can leave a message.

## 2012-10-03 ENCOUNTER — Other Ambulatory Visit: Payer: Self-pay

## 2013-06-24 ENCOUNTER — Other Ambulatory Visit: Payer: Self-pay

## 2015-03-10 ENCOUNTER — Encounter (HOSPITAL_COMMUNITY): Payer: Self-pay | Admitting: Emergency Medicine

## 2015-03-10 ENCOUNTER — Emergency Department (HOSPITAL_COMMUNITY)
Admission: EM | Admit: 2015-03-10 | Discharge: 2015-03-10 | Disposition: A | Discharge status: Home / Self Care | Payer: 59 | Attending: Emergency Medicine | Admitting: Emergency Medicine

## 2015-03-10 DIAGNOSIS — Z79899 Other long term (current) drug therapy: Secondary | ICD-10-CM | POA: Insufficient documentation

## 2015-03-10 DIAGNOSIS — R011 Cardiac murmur, unspecified: Secondary | ICD-10-CM | POA: Diagnosis not present

## 2015-03-10 DIAGNOSIS — R2243 Localized swelling, mass and lump, lower limb, bilateral: Secondary | ICD-10-CM | POA: Diagnosis not present

## 2015-03-10 DIAGNOSIS — R600 Localized edema: Secondary | ICD-10-CM

## 2015-03-10 DIAGNOSIS — M2540 Effusion, unspecified joint: Secondary | ICD-10-CM | POA: Diagnosis present

## 2015-03-10 DIAGNOSIS — I10 Essential (primary) hypertension: Secondary | ICD-10-CM | POA: Insufficient documentation

## 2015-03-10 LAB — I-STAT CHEM 8, ED
BUN: 12 mg/dL (ref 6–20)
CALCIUM ION: 1.11 mmol/L — AB (ref 1.12–1.23)
CHLORIDE: 102 mmol/L (ref 101–111)
CREATININE: 0.9 mg/dL (ref 0.44–1.00)
GLUCOSE: 90 mg/dL (ref 65–99)
HEMATOCRIT: 38 % (ref 36.0–46.0)
Hemoglobin: 12.9 g/dL (ref 12.0–15.0)
Potassium: 3.5 mmol/L (ref 3.5–5.1)
Sodium: 139 mmol/L (ref 135–145)
TCO2: 21 mmol/L (ref 0–100)

## 2015-03-10 MED ORDER — FUROSEMIDE 20 MG PO TABS: 20.0000 mg | ORAL_TABLET | Freq: Every day | ORAL | Status: DC

## 2015-03-10 NOTE — ED Notes (Signed)
Patient states she has ongoing problems with swelling of both ankles but since last week her ankles have remained swollen.  She denies any falls or injuries.  She denies any insect bites.  She states she has some problems walking due to tightness of her legs.

## 2015-03-10 NOTE — ED Provider Notes (Signed)
CSN: [id number]     Arrival date & time 03/10/15  2223 History   This chart was scribed for non-physician practitioner Elpidio Anis, PA-C working with Zadie Rhine, MD by Murriel Hopper, ED Scribe. This patient was seen in room WTR6/WTR6 and the patient's care was started at 11:15 PM.  Chief Complaint  Patient presents with  . Joint Swelling     The history is provided by the patient. No language interpreter was used.    HPI Comments:  Elizabeth Crane is a 34 y.o. female brought in by parents to the Emergency Department complaining of bilateral lower extremity swelling worse than usual swelling that began a week ago. She has had redness develop over the last 2-3 days and became concerned for infection. No known fever, no recent change to medications, no history of diuretic use.     Past Medical History  Diagnosis Date  . Hypertension 2002    age 71  . Heart murmur 2011    normal w/u including ECHO   History reviewed. No pertinent past surgical history. Family History  Problem Relation Age of Onset  . Hypertension Mother   . Hypertension Father    History  Substance Use Topics  . Smoking status: Never Smoker   . Smokeless tobacco: Never Used  . Alcohol Use: 0.0 oz/week    0 Glasses of wine per week   OB History    No data available     Review of Systems  Constitutional: Negative for fever.  Respiratory: Negative for shortness of breath.   Cardiovascular: Positive for leg swelling. Negative for chest pain.  Skin: Positive for color change.      Allergies  Review of patient's allergies indicates no known allergies.  Home Medications   Prior to Admission medications   Medication Sig Start Date End Date Taking? Authorizing Provider  Polyethylene Glycol 400 (BLINK TEARS OP) Apply 1 drop to eye 2 (two) times daily as needed (for dry eyes).   Yes Historical Provider, MD  benazepril (LOTENSIN) 20 MG tablet Take 1 tablet (20 mg total) by mouth daily. 07/13/12   Josalyn  Funches, MD  hydrochlorothiazide (HYDRODIURIL) 25 MG tablet Take 1 tablet (25 mg total) by mouth daily. 07/13/12   Josalyn Funches, MD  metroNIDAZOLE (FLAGYL) 500 MG tablet Take 1 tablet (500 mg total) by mouth 2 (two) times daily. 07/13/12   Dessa Phi, MD  norgestimate-ethinyl estradiol (ORTHO-CYCLEN,SPRINTEC,PREVIFEM) 0.25-35 MG-MCG tablet Take 1 tablet by mouth daily. 07/13/12   Josalyn Funches, MD   BP 128/82 mmHg  Pulse 70  Temp(Src) 99.5 F (37.5 C) (Oral)  Resp 16  SpO2 100%  LMP 03/09/2015 Physical Exam  Constitutional: She is oriented to person, place, and time. She appears well-developed and well-nourished.  HENT:  Head: Normocephalic and atraumatic.  Cardiovascular: Normal rate.   Pulmonary/Chest: Effort normal.  Abdominal: She exhibits no distension.  Musculoskeletal:  Bilateral lower extremity edema including feet Non-pitting with patchy areas of erythema No skin breakdown  No drainage  Distal pulses intact  Neurological: She is alert and oriented to person, place, and time.  Skin: Skin is warm and dry.  Psychiatric: She has a normal mood and affect.  Nursing note and vitals reviewed.   ED Course  Procedures (including critical care time)  DIAGNOSTIC STUDIES: Oxygen Saturation is 100% on room air, normal by my interpretation.    COORDINATION OF CARE: 11:18 PM Discussed treatment plan with pt at bedside and pt agreed to plan.   Labs  Review Labs Reviewed - No data to display Results for orders placed or performed during the hospital encounter of 03/10/15  I-stat Chem 8, ED  Result Value Ref Range   Sodium 139 135 - 145 mmol/L   Potassium 3.5 3.5 - 5.1 mmol/L   Chloride 102 101 - 111 mmol/L   BUN 12 6 - 20 mg/dL   Creatinine, Ser 1.61 0.44 - 1.00 mg/dL   Glucose, Bld 90 65 - 99 mg/dL   Calcium, Ion 0.96 (L) 1.12 - 1.23 mmol/L   TCO2 21 0 - 100 mmol/L   Hemoglobin 12.9 12.0 - 15.0 g/dL   HCT 04.5 40.9 - 81.1 %    Imaging Review No results  found.   EKG Interpretation None      MDM   Final diagnoses:  None    1. LE Edema  No evidence cellulitis, abscess or infection. She has a history of LE edema that appears to be worsening. Will start on Lasix (20 mEq daily) and recommend PCP follow up this week.   I personally performed the services described in this documentation, which was scribed in my presence. The recorded information has been reviewed and is accurate.     Elpidio Anis, PA-C 03/10/15 2340  Zadie Rhine, MD 03/11/15 9147  Zadie Rhine, MD 03/11/15 [phone]

## 2015-03-10 NOTE — Discharge Instructions (Signed)
FOLLOW UP WITH YOUR DOCTOR THIS WEEK FOR RECHECK OF LOWER EXTREMITY SWELLING. RETURN TO THE EMERGENCY DEPARTMENT WITH ANY FEVER, SEVERE PAIN OR NEW CONCERN.  Edema Edema is an abnormal buildup of fluids in your bodytissues. Edema is somewhatdependent on gravity to pull the fluid to the lowest place in your body. That makes the condition more common in the legs and thighs (lower extremities). Painless swelling of the feet and ankles is common and becomes more likely as you get older. It is also common in looser tissues, like around your eyes.  When the affected area is squeezed, the fluid may move out of that spot and leave a dent for a few moments. This dent is called pitting.  CAUSES  There are many possible causes of edema. Eating too much salt and being on your feet or sitting for a long time can cause edema in your legs and ankles. Hot weather may make edema worse. Common medical causes of edema include:  Heart failure.  Liver disease.  Kidney disease.  Weak blood vessels in your legs.  Cancer.  An injury.  Pregnancy.  Some medications.  Obesity. SYMPTOMS  Edema is usually painless.Your skin may look swollen or shiny.  DIAGNOSIS  Your health care provider may be able to diagnose edema by asking about your medical history and doing a physical exam. You may need to have tests such as X-rays, an electrocardiogram, or blood tests to check for medical conditions that may cause edema.  TREATMENT  Edema treatment depends on the cause. If you have heart, liver, or kidney disease, you need the treatment appropriate for these conditions. General treatment may include:  Elevation of the affected body part above the level of your heart.  Compression of the affected body part. Pressure from elastic bandages or support stockings squeezes the tissues and forces fluid back into the blood vessels. This keeps fluid from entering the tissues.  Restriction of fluid and salt intake.  Use of  a water pill (diuretic). These medications are appropriate only for some types of edema. They pull fluid out of your body and make you urinate more often. This gets rid of fluid and reduces swelling, but diuretics can have side effects. Only use diuretics as directed by your health care provider. HOME CARE INSTRUCTIONS   Keep the affected body part above the level of your heart when you are lying down.   Do not sit still or stand for prolonged periods.   Do not put anything directly under your knees when lying down.  Do not wear constricting clothing or garters on your upper legs.   Exercise your legs to work the fluid back into your blood vessels. This may help the swelling go down.   Wear elastic bandages or support stockings to reduce ankle swelling as directed by your health care provider.   Eat a low-salt diet to reduce fluid if your health care provider recommends it.   Only take medicines as directed by your health care provider. SEEK MEDICAL CARE IF:   Your edema is not responding to treatment.  You have heart, liver, or kidney disease and notice symptoms of edema.  You have edema in your legs that does not improve after elevating them.   You have sudden and unexplained weight gain. SEEK IMMEDIATE MEDICAL CARE IF:   You develop shortness of breath or chest pain.   You cannot breathe when you lie down.  You develop pain, redness, or warmth in the swollen areas.  You have heart, liver, or kidney disease and suddenly get edema.  You have a fever and your symptoms suddenly get worse. MAKE SURE YOU:   Understand these instructions.  Will watch your condition.  Will get help right away if you are not doing well or get worse. Document Released: 08/05/2005 Document Revised: 12/20/2013 Document Reviewed: 05/28/2013 [facility] Patient Information 2015 [place], [place]. This information is not intended to replace advice given to you by your health care provider.  Make sure you discuss any questions you have with your health care provider.

## 2015-03-10 NOTE — ED Provider Notes (Signed)
Patient seen/examined in the Emergency Department in conjunction with Midlevel Provider  Patient reports LE edema Exam : awake/alert, peripheral edema noted with minimal erythema Plan: she admits this has been ongoing issues, denies h/o CHF/DVT Advised labs and short course of lasix and PCP followup   Zadie Rhine, MD 03/10/15 2325

## 2017-06-11 ENCOUNTER — Emergency Department (HOSPITAL_BASED_OUTPATIENT_CLINIC_OR_DEPARTMENT_OTHER)
Admission: EM | Admit: 2017-06-11 | Discharge: 2017-06-11 | Disposition: A | Discharge status: Home / Self Care | Payer: Self-pay | Attending: Emergency Medicine | Admitting: Emergency Medicine

## 2017-06-11 ENCOUNTER — Emergency Department (HOSPITAL_BASED_OUTPATIENT_CLINIC_OR_DEPARTMENT_OTHER): Payer: Self-pay

## 2017-06-11 ENCOUNTER — Encounter (HOSPITAL_BASED_OUTPATIENT_CLINIC_OR_DEPARTMENT_OTHER): Payer: Self-pay | Admitting: *Deleted

## 2017-06-11 DIAGNOSIS — I10 Essential (primary) hypertension: Secondary | ICD-10-CM | POA: Insufficient documentation

## 2017-06-11 DIAGNOSIS — R059 Cough, unspecified: Secondary | ICD-10-CM

## 2017-06-11 DIAGNOSIS — R05 Cough: Secondary | ICD-10-CM | POA: Insufficient documentation

## 2017-06-11 MED ORDER — HYDROCHLOROTHIAZIDE 25 MG PO TABS: 25.0000 mg | ORAL_TABLET | Freq: Every day | ORAL | 1 refills | Status: AC

## 2017-06-11 NOTE — Discharge Instructions (Signed)
Start taking blood pressure medicine as prescribed.  Try Robitussin or any other over-the-counter cough medications for your cough.  Please follow-up with a family doctor, you can try [facility] as referred.  Return if any worsening symptoms.

## 2017-06-11 NOTE — ED Provider Notes (Signed)
[facility] EMERGENCY DEPARTMENT Provider Note   CSN: [id number] Arrival date & time: 06/11/17  1734     History   Chief Complaint Chief Complaint  Patient presents with  . URI    HPI Elizabeth Crane is a 36 y.o. female.  HPI Elizabeth Crane is a 36 y.o. female presents to emergency department complaining of shortness of breath and cough.  Patient states that she has developed symptoms about 3 days ago.  She reports pressure in bilateral lower chest that is worse with coughing.  She states her cough is nonproductive.  Denies any exertional symptoms.  Denies any nasal congestion or sore throat.  No fever or chills.  She has not tried any medications for this prior to coming in.  Nothing making her symptoms better or worse.  She states that she was at work and was told that she needed to go see a doctor.  She states she did not want to come and thinks is "nothing" but was forced to come here.  Past Medical History:  Diagnosis Date  . Heart murmur 2011   normal w/u including ECHO  . Hypertension 2002   age 36    Patient Active Problem List   Diagnosis Date Noted  . Possible exposure to STD 07/13/2012  . OBESITY 02/12/2010  . FATIGUE 02/12/2010  . MURMUR 02/12/2010  . LEG EDEMA, BILATERAL 01/19/2008  . HEADACHE 01/19/2008  . DEPRESSION, RECURRENT 04/13/2007  . HYPERTENSION 04/09/2007    History reviewed. No pertinent surgical history.  OB History    No data available       Home Medications    Prior to Admission medications   Medication Sig Start Date End Date Taking? Authorizing Provider  etonogestrel (NEXPLANON) 68 MG IMPL implant 1 each by Subdermal route once.    [provider]  Polyethylene Glycol 400 (BLINK TEARS OP) Apply 1 drop to eye 2 (two) times daily as needed (for dry eyes).    [provider]  PRESCRIPTION MEDICATION Patient takes unknown blood pressure medication.    [provider]    Family History Family  History  Problem Relation Age of Onset  . Hypertension Mother   . Hypertension Father     Social History Social History  Substance Use Topics  . Smoking status: Never Smoker  . Smokeless tobacco: Never Used  . Alcohol use 0.0 oz/week     Allergies   Patient has no known allergies.   Review of Systems Review of Systems  Constitutional: Negative for chills and fever.  Respiratory: Positive for cough, chest tightness and shortness of breath.   Cardiovascular: Positive for chest pain. Negative for palpitations and leg swelling.  Gastrointestinal: Negative for abdominal pain, diarrhea, nausea and vomiting.  Genitourinary: Negative for dysuria, flank pain, pelvic pain, vaginal bleeding, vaginal discharge and vaginal pain.  Musculoskeletal: Negative for arthralgias, myalgias, neck pain and neck stiffness.  Skin: Negative for rash.  Neurological: Negative for dizziness, weakness and headaches.  All other systems reviewed and are negative.    Physical Exam Updated Vital Signs BP (!) 167/90   Pulse 75   Temp 98.1 F (36.7 C) (Oral)   Resp 18   Ht 5\' 7"  (1.702 m)   Wt 77.6 kg (171 lb)   LMP 06/03/2017   SpO2 100%   BMI 26.78 kg/m   Physical Exam  Constitutional: She appears well-developed and well-nourished. No distress.  HENT:  Head: Normocephalic.  Right Ear: External ear normal.  Left Ear: External ear normal.  Nose: Nose normal.  Mouth/Throat: Oropharynx is clear and moist. No oropharyngeal exudate.  Eyes: Conjunctivae are normal.  Neck: Neck supple.  Cardiovascular: Normal rate, regular rhythm and normal heart sounds.   Pulmonary/Chest: Effort normal and breath sounds normal. No respiratory distress. She has no wheezes. She has no rales.  Abdominal: Soft. Bowel sounds are normal. She exhibits no distension. There is no tenderness. There is no rebound.  Musculoskeletal: She exhibits no edema.  Neurological: She is alert.  Skin: Skin is warm and dry.    Psychiatric: She has a normal mood and affect. Her behavior is normal.  Nursing note and vitals reviewed.    ED Treatments / Results  Labs (all labs ordered are listed, but only abnormal results are displayed) Labs Reviewed - No data to display  EKG  EKG Interpretation  Date/Time:  Wednesday June 11 2017 19:40:29 EDT Ventricular Rate:  67 PR Interval:  148 QRS Duration: 98 QT Interval:  418 QTC Calculation: 441 R Axis:   -70 Text Interpretation:  Normal sinus rhythm Left axis deviation Low voltage QRS Abnormal ECG When compared to prior, no significant changes seen.  No STEMI Confirmed by Theda Belfast ([id number]) on 06/11/2017 8:04:02 PM       Radiology Dg Chest 2 View  Result Date: 06/11/2017 CLINICAL DATA:  Shortness of breath and cough EXAM: CHEST  2 VIEW COMPARISON:  None. FINDINGS: The heart size and mediastinal contours are within normal limits. Both lungs are clear. The visualized skeletal structures are unremarkable. IMPRESSION: No active cardiopulmonary disease. Electronically Signed   By: Alcide Clever M.D.   On: 06/11/2017 20:06    Procedures Procedures (including critical care time)  Medications Ordered in ED Medications - No data to display   Initial Impression / Assessment and Plan / ED Course  I have reviewed the triage vital signs and the nursing notes.  Pertinent labs & imaging results that were available during my care of the patient were reviewed by me and considered in my medical decision making (see chart for details).     Pt with chest pressure, dry cough. Normal exam. Lungs clear. Will get ECG and chest xray. Pt is hypertensive, otherwise normal vital sings.   8:42 PM Chest x-ray is negative.  EKG unremarkable.  Patient admits that she has been working double shifts every day, admits to being tired and states may be that is the reason she is not feeling well.  She has no URI symptoms other than a nonproductive cough.  Her lungs are clear on  exam.  Her vital signs are all within normal other than slightly elevated blood pressure.  She states she has diagnosis of high blood pressure but does not have any medications at this time.  I will refer her to a component of an wellness community clinic.  I will prescribe her hydrochlorothiazide.  Will discharge home with close outpatient follow-up.  Return precautions discussed.  Vitals:   06/11/17 1741 06/11/17 1743 06/11/17 2021  BP:  (!) 167/90 139/72  Pulse:  75 72  Resp:  18 18  Temp:  98.1 F (36.7 C)   TempSrc:  Oral   SpO2:  100% 100%  Weight: 77.6 kg (171 lb)    Height: 5\' 7"  (1.702 m)       Final Clinical Impressions(s) / ED Diagnoses   Final diagnoses:  Cough  Hypertension, unspecified type    New Prescriptions New Prescriptions   HYDROCHLOROTHIAZIDE (HYDRODIURIL) 25  MG TABLET    Take 1 tablet (25 mg total) by mouth daily.     Jaynie Crumble, PA-C 06/11/17 2044    Tegeler, Canary Brim, MD 06/11/17 2250

## 2017-06-11 NOTE — ED Notes (Signed)
ED Provider at bedside. 

## 2017-06-11 NOTE — ED Triage Notes (Signed)
Pt c/o URi symptoms x 1 week ago

## 2018-11-03 IMAGING — DX DG CHEST 2V
2 series · 2 of 2 positions shown · non-contrast
Comparison: None.

CLINICAL DATA: Shortness of breath and cough

EXAM:
CHEST  2 VIEW

[chest pa]
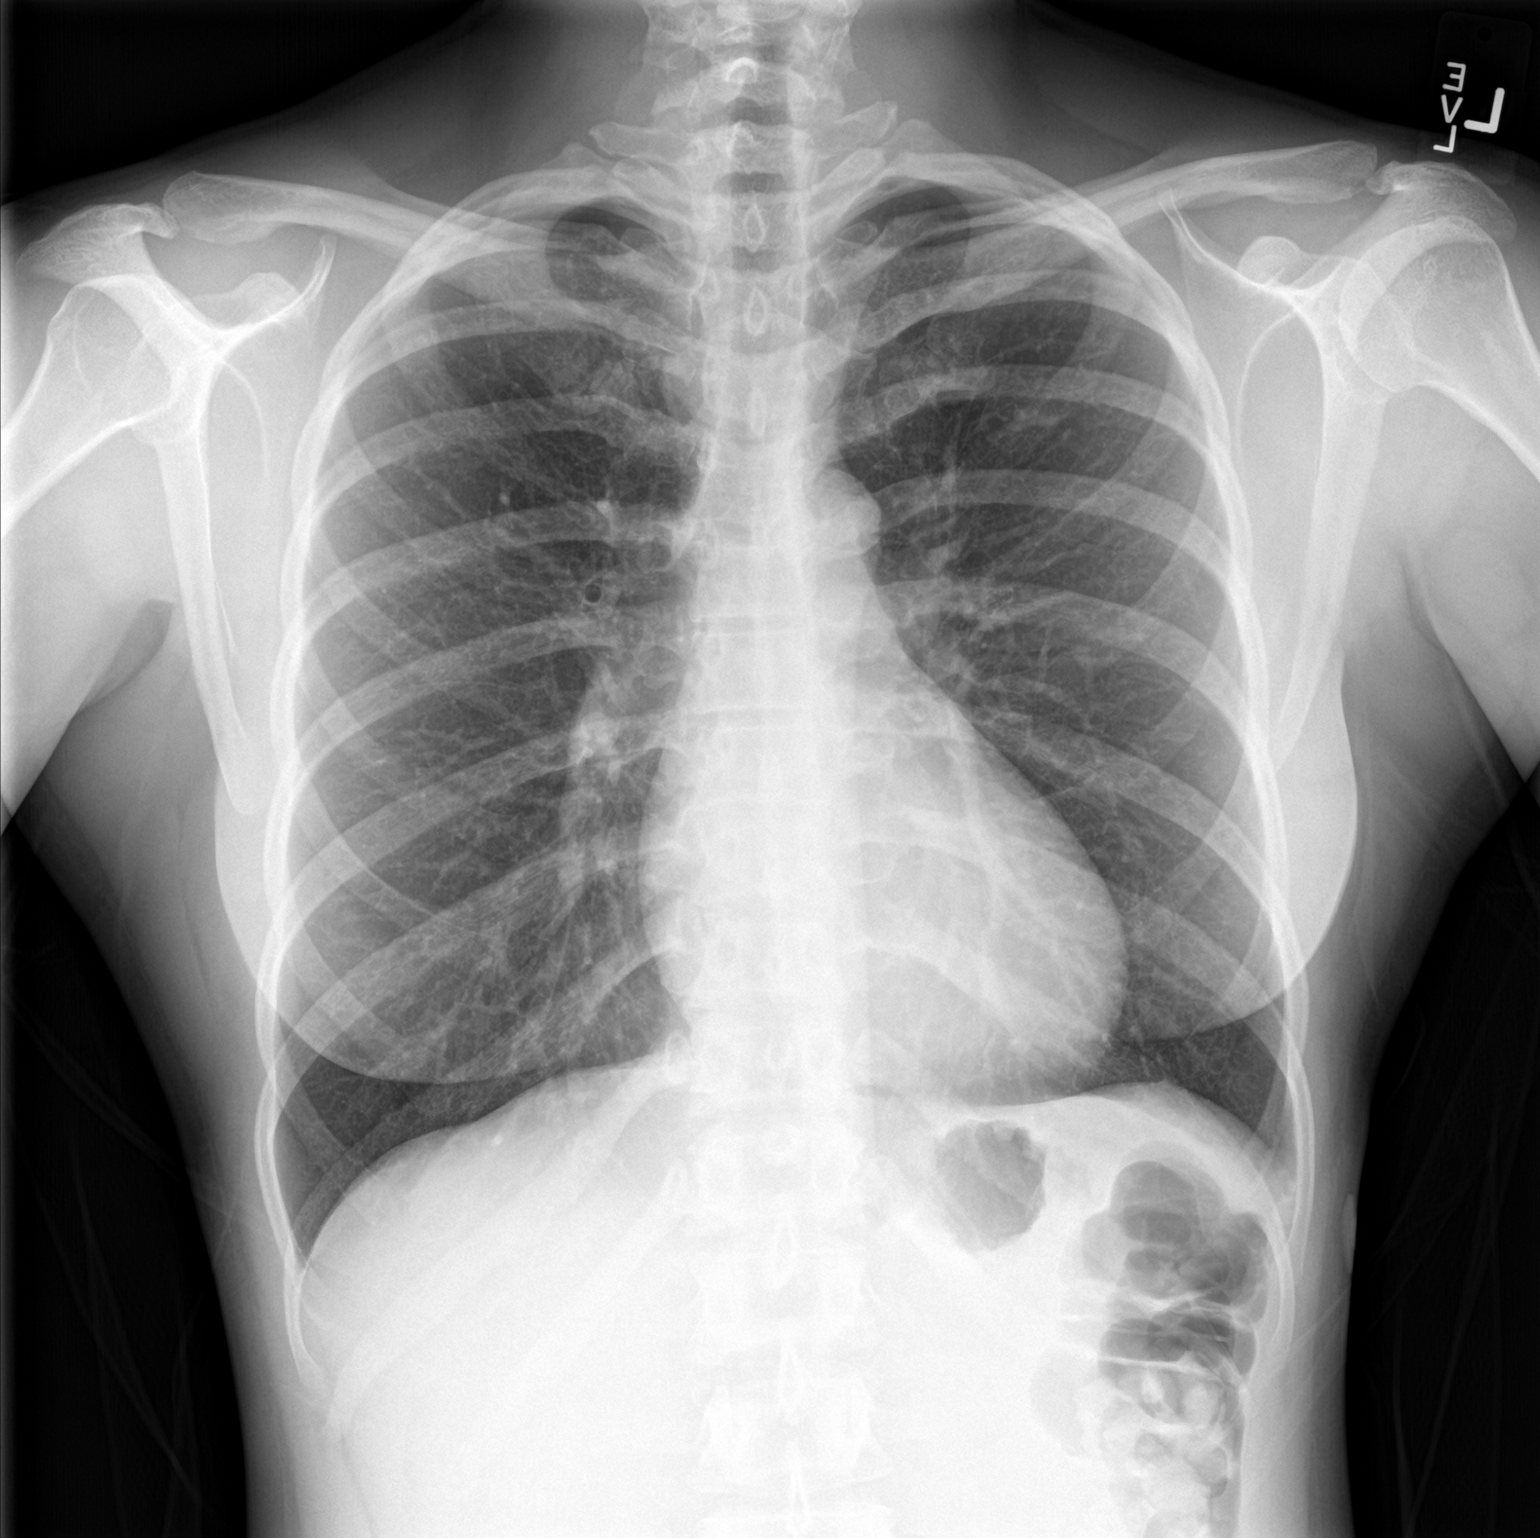

[chest lat]
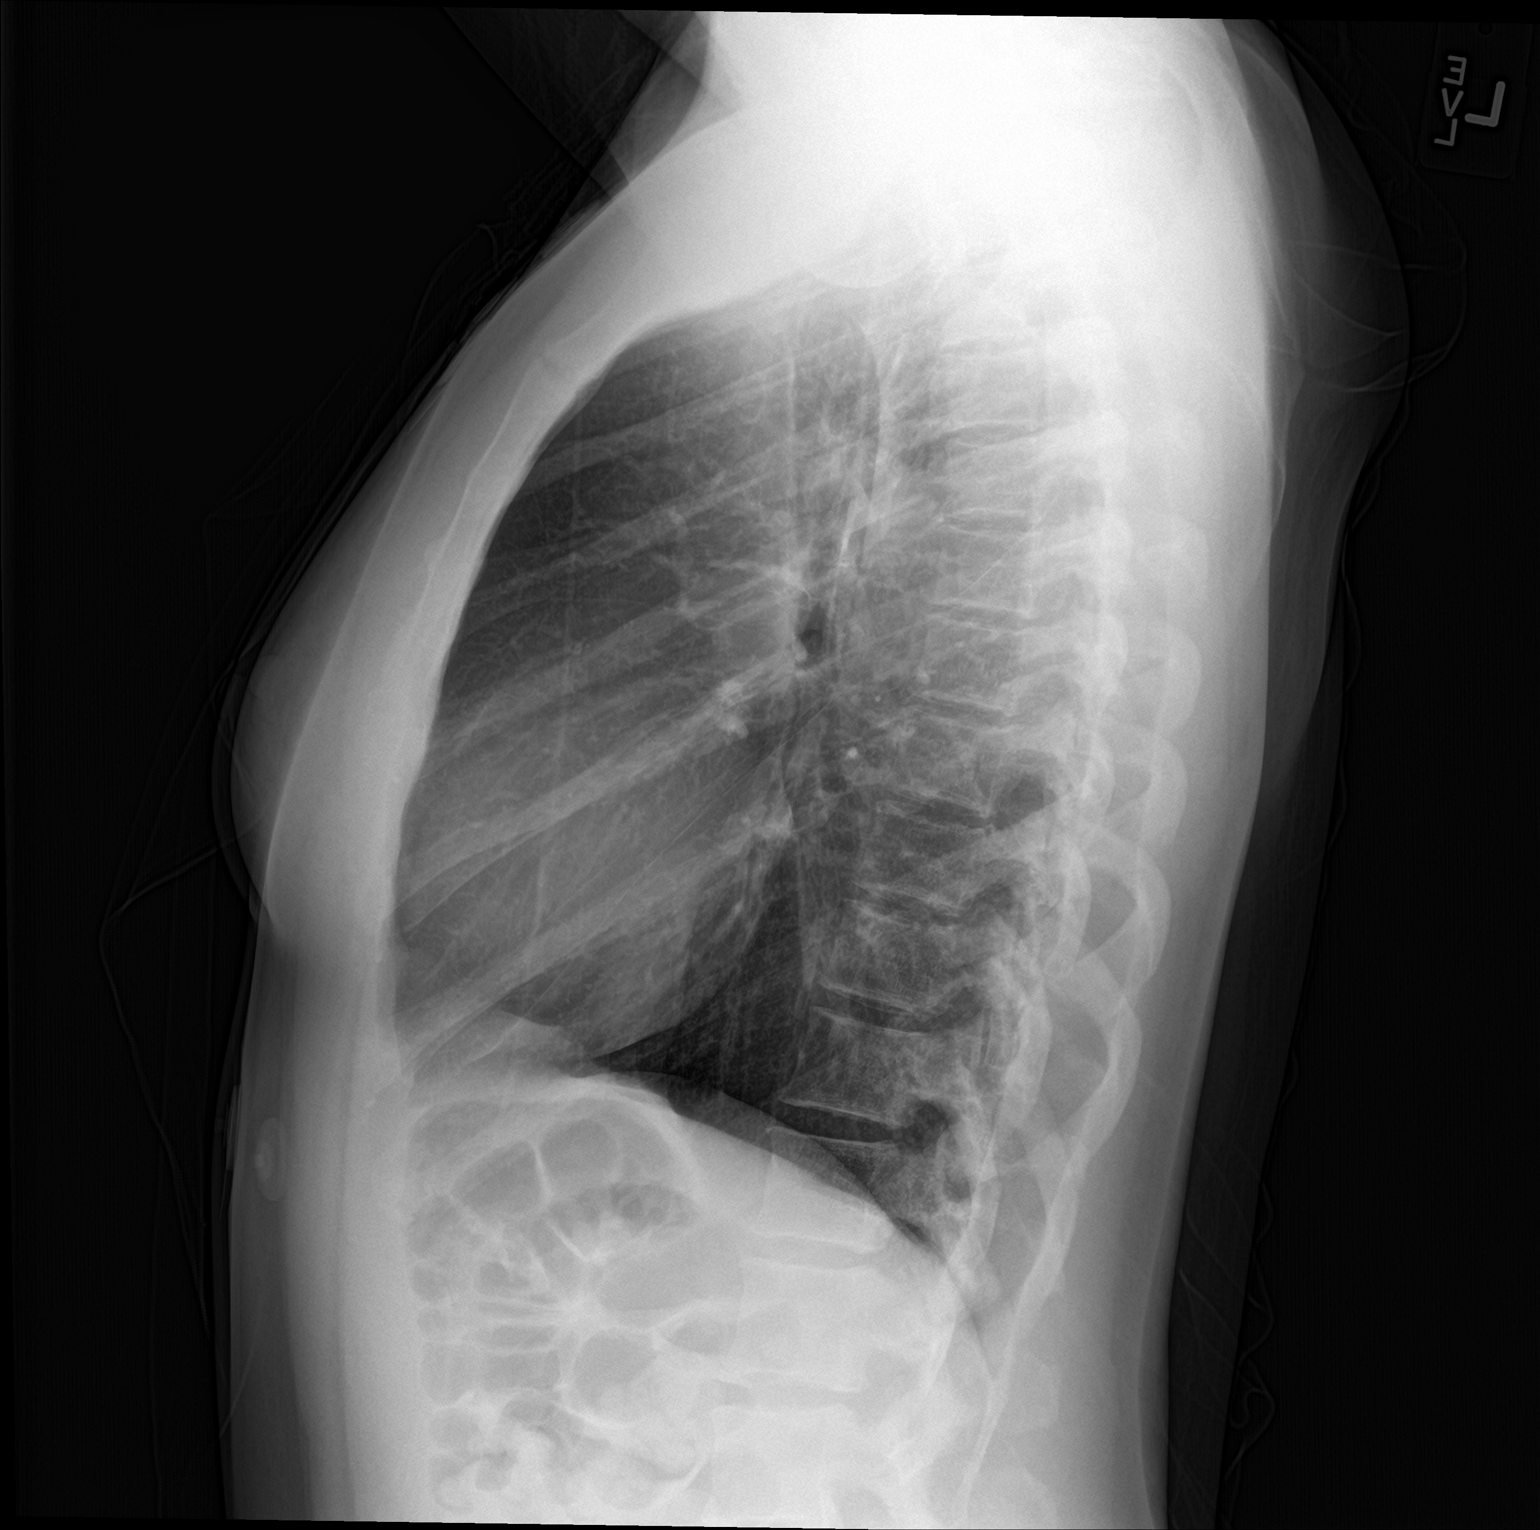

[2 of 2 positions shown; findings below may reference images not displayed]

FINDINGS: The heart size and mediastinal contours are within normal limits.
Both lungs are clear. The visualized skeletal structures are
unremarkable.
IMPRESSION: No active cardiopulmonary disease.
